# Patient Record
Sex: Female | Born: 1995 | Race: Black or African American | Hispanic: No | Marital: Single | Smoking: Current every day smoker
Health system: Southern US, Community
[De-identification: ages and names within clinical notes are randomized; demographics above are authoritative.]

## PROBLEM LIST (undated history)

## (undated) DIAGNOSIS — T7840XA Allergy, unspecified, initial encounter: Secondary | ICD-10-CM

## (undated) HISTORY — DX: Allergy, unspecified, initial encounter: T78.40XA

---

## 2015-06-16 ENCOUNTER — Ambulatory Visit (INDEPENDENT_AMBULATORY_CARE_PROVIDER_SITE_OTHER): Payer: BLUE CROSS/BLUE SHIELD | Admitting: Emergency Medicine

## 2015-06-16 VITALS — BP 110/68 | HR 74 | Temp 99.7°F | Resp 16 | Ht 65.5 in | Wt 107.2 lb

## 2015-06-16 DIAGNOSIS — R6889 Other general symptoms and signs: Secondary | ICD-10-CM

## 2015-06-16 DIAGNOSIS — R509 Fever, unspecified: Secondary | ICD-10-CM

## 2015-06-16 LAB — POCT INFLUENZA A/B
INFLUENZA A, POC: NEGATIVE
Influenza B, POC: NEGATIVE

## 2015-06-16 MED ORDER — OSELTAMIVIR PHOSPHATE 75 MG PO CAPS: 75.0000 mg | ORAL_CAPSULE | Freq: Two times a day (BID) | ORAL | Status: DC

## 2015-06-16 NOTE — Progress Notes (Signed)
Patient ID: Sally Roberts, female   DOB: 1995-05-06, 20 y.o.   MRN: [id number]     By signing my name below, I, Littie Deeds, attest that this documentation has been prepared under the direction and in the presence of Lesle Chris, MD.  Electronically Signed: Littie Deeds, Medical Scribe. 06/16/2015. 10:57 AM.   Chief Complaint:  Chief Complaint  Patient presents with  . Influenza    HPI: Sally Roberts is a 20 y.o. female who reports to [facility] today complaining of gradual onset cough that started last night. Patient reports having associated fever, chills, headache, and generalized myalgias. She believes she may have the flu. Patient denies sick contacts with flu. She did not receive the flu shot this year. She does not do any regular exercise. There have been no tick exposure.  Patient is in school and also works at [employer].  Past Medical History  Diagnosis Date  . Allergy    No past surgical history on file. Social History   Social History  . Marital Status: Single    Spouse Name: N/A  . Number of Children: N/A  . Years of Education: N/A   Social History Main Topics  . Smoking status: Current Every Day Smoker  . Smokeless tobacco: None  . Alcohol Use: 0.0 oz/week    0 Standard drinks or equivalent per week  . Drug Use: None  . Sexual Activity: Not Asked   Other Topics Concern  . None   Social History Narrative  . None   Family History  Problem Relation Age of Onset  . Diabetes Maternal Grandfather   . Diabetes Paternal Grandmother    Allergies  Allergen Reactions  . Shellfish Allergy Shortness Of Breath and Other (See Comments)    Sores in mouth   Prior to Admission medications   Medication Sig Start Date End Date Taking? Authorizing Provider  medroxyPROGESTERone (DEPO-PROVERA) 150 MG/ML injection Inject 150 mg into the muscle every 3 (three) months.   Yes Historical Provider, MD     ROS: The patient denies unintentional weight loss,  chest pain, palpitations, wheezing, dyspnea on exertion, nausea, vomiting, abdominal pain, dysuria, hematuria, melena, numbness, weakness, or tingling.  All other systems have been reviewed and were otherwise negative with the exception of those mentioned in the HPI and as above.    PHYSICAL EXAM: Filed Vitals:   06/16/15 1037  BP: 110/68  Pulse: 74  Temp: 99.7 F (37.6 C)  Resp: 16   Body mass index is 17.56 kg/(m^2).   General: Alert, no acute distress. Appears ill, but non-toxic. HEENT:  Normocephalic, atraumatic, oropharynx patent. Nasal congestion.  Eye: Nonie Hoyer [facility] Cardiovascular:  Regular rate and rhythm, no rubs murmurs or gallops.  No Carotid bruits, radial pulse intact. No pedal edema.  Respiratory: Clear to auscultation bilaterally.  No wheezes, rales, or rhonchi.  No cyanosis, no use of accessory musculature Abdominal: No organomegaly, abdomen is soft and non-tender, positive bowel sounds.  No masses. Musculoskeletal: Gait intact. No edema, tenderness Skin: No rashes. Neurologic: Facial musculature symmetric. Psychiatric: Patient acts appropriately throughout our interaction. Lymphatic: No cervical or submandibular lymphadenopathy    LABS: Results for orders placed or performed in visit on 06/16/15  POCT Influenza A/B  Result Value Ref Range   Influenza A, POC Negative Negative   Influenza B, POC Negative Negative     EKG/XRAY:   Primary read interpreted by Dr. Cleta Alberts at [facility].   ASSESSMENT/PLAN: Patient presents with what we have been  seeing his typical flu symptoms. Placed on Tamiflu. The flu swab we did was negative. She was given notes for work.I personally performed the services described in this documentation, which was scribed in my presence. The recorded information has been reviewed and is accurate.    Gross sideeffects, risk and benefits, and alternatives of medications d/w patient. Patient is aware that all medications have potential sideeffects  and we are unable to predict every sideeffect or drug-drug interaction that may occur.  Lesle ChrisSteven Daub MD 06/16/2015 10:57 AM

## 2015-06-16 NOTE — Patient Instructions (Signed)

## 2015-06-23 ENCOUNTER — Ambulatory Visit: Payer: BLUE CROSS/BLUE SHIELD

## 2015-07-01 ENCOUNTER — Emergency Department (HOSPITAL_COMMUNITY)
Admission: EM | Admit: 2015-07-01 | Discharge: 2015-07-01 | Disposition: A | Discharge status: Home / Self Care | Payer: BLUE CROSS/BLUE SHIELD | Attending: Emergency Medicine | Admitting: Emergency Medicine

## 2015-07-01 ENCOUNTER — Encounter (HOSPITAL_COMMUNITY): Payer: Self-pay | Admitting: Family Medicine

## 2015-07-01 DIAGNOSIS — F17200 Nicotine dependence, unspecified, uncomplicated: Secondary | ICD-10-CM | POA: Diagnosis not present

## 2015-07-01 DIAGNOSIS — Z79899 Other long term (current) drug therapy: Secondary | ICD-10-CM | POA: Diagnosis not present

## 2015-07-01 DIAGNOSIS — H109 Unspecified conjunctivitis: Secondary | ICD-10-CM | POA: Diagnosis not present

## 2015-07-01 DIAGNOSIS — H578 Other specified disorders of eye and adnexa: Secondary | ICD-10-CM | POA: Diagnosis present

## 2015-07-01 MED ORDER — POLYMYXIN B-TRIMETHOPRIM 10000-0.1 UNIT/ML-% OP SOLN: 1.0000 [drp] | OPHTHALMIC | Status: DC

## 2015-07-01 NOTE — ED Provider Notes (Signed)
CSN: [id number]     Arrival date & time 07/01/15  1020 History  By signing my name below, I, Placido SouLogan Joldersma, attest that this documentation has been prepared under the direction and in the presence of [facility] HospitalJaime Pilcher Sedalia Greeson, PA-C. Electronically Signed: Placido SouLogan Joldersma, ED Scribe. 07/01/2015. 11:36 AM.   Chief Complaint  Patient presents with  . Eye Problem   The history is provided by the patient. No language interpreter was used.    HPI Comments: Sally Roberts is a 20 y.o. female who presents to the Emergency Department complaining of worsening, mild, bilateral eye pain x 1 week with associated, mild, worsening, left eye swelling x 1 day. She reports initially going to the doctor when her symptoms presented and left prior to being seen due to a co-pay and a lack of funds. She reports associated, mild, eye crusting. Pt has applied OTC eyedrops without significant relief. Pt reports having flu-like symptoms 2 weeks ago which alleviated prior to the onset of her eye symptoms. Pt denies wearing rx eyewear or contact lenses. She denies visual disturbances or any other associated symptoms at this time.    Past Medical History  Diagnosis Date  . Allergy    History reviewed. No pertinent past surgical history. Family History  Problem Relation Age of Onset  . Diabetes Maternal Grandfather   . Diabetes Paternal Grandmother    Social History  Substance Use Topics  . Smoking status: Current Every Day Smoker  . Smokeless tobacco: None  . Alcohol Use: 0.0 oz/week    0 Standard drinks or equivalent per week   OB History    No data available     Review of Systems  HENT: Negative for congestion.   Eyes: Positive for pain, discharge and redness. Negative for visual disturbance.  Respiratory: Negative for cough.     Allergies  Shellfish allergy  Home Medications   Prior to Admission medications   Medication Sig Start Date End Date Taking? Authorizing Provider  medroxyPROGESTERone  (DEPO-PROVERA) 150 MG/ML injection Inject 150 mg into the muscle every 3 (three) months.    Historical Provider, MD  oseltamivir (TAMIFLU) 75 MG capsule Take 1 capsule (75 mg total) by mouth 2 (two) times daily. 06/16/15   Collene GobbleSteven A Daub, MD  trimethoprim-polymyxin b (POLYTRIM) ophthalmic solution Place 1 drop into the left eye every 4 (four) hours. X 7 days. 07/01/15   Timoteo Carreiro Pilcher Tenee Wish, PA-C   BP 130/70 mmHg  Pulse 64  Temp(Src) 98 F (36.7 C) (Oral)  Resp 16  Wt 47.344 kg  SpO2 100%  LMP  (LMP Unknown)    Physical Exam  Constitutional: She is oriented to person, place, and time. She appears well-developed and well-nourished.  HENT:  Head: Normocephalic and atraumatic.  Mouth/Throat: Oropharynx is clear and moist. No oropharyngeal exudate.  Eyes: EOM are normal. Pupils are equal, round, and reactive to light.  Bilateral conjunctiva mildly injected. Left greater than right.  Neck: Normal range of motion.  Cardiovascular: Normal rate, regular rhythm and normal heart sounds.  Exam reveals no gallop and no friction rub.   No murmur heard. Pulmonary/Chest: Effort normal and breath sounds normal. No respiratory distress. She has no wheezes. She has no rales.  Musculoskeletal: Normal range of motion.  Lymphadenopathy:    She has no cervical adenopathy.  Neurological: She is alert and oriented to person, place, and time.  Skin: Skin is warm and dry.  Nursing note and vitals reviewed.   ED Course  Procedures  DIAGNOSTIC STUDIES: Oxygen Saturation is 100% on RA, normal by my interpretation.    COORDINATION OF CARE: 11:35 AM Discussed next steps with pt. She verbalized understanding and is agreeable with the plan.   Labs Review Labs Reviewed - No data to display  Imaging Review No results found.   EKG Interpretation None      MDM   Final diagnoses:  Conjunctivitis of left eye   Sally Roberts presents to ED for bilateral eye discharge, irritation. Left eye worse than  right. On exam, bilateral conjunctiva mildly injected, left slightly greater than right. Left eye with mild swelling appreciated. Viral versus bacterial conjunctivitis-will treat with Polytrim. Patient informed to follow-up with PCP if symptoms do not improve or worsen. She is not a contact wearer. All questions were answered.  I personally performed the services described in this documentation, which was scribed in my presence. The recorded information has been reviewed and is accurate.  [facility] Susy Placzek, PA-C 07/01/15 1323  Cathren Laine, MD 07/02/15 [phone]

## 2015-07-01 NOTE — ED Notes (Signed)
Declined W/C at D/C and was escorted to lobby by RN. 

## 2015-07-01 NOTE — ED Notes (Signed)
Pt here for right eye swelling. sts that she has been using eye drops OTC without relief. sts minimal  pain. No vision disturbances.

## 2015-07-01 NOTE — Discharge Instructions (Signed)
Conjunctivitis is commonly called "pink eye." Conjunctivitis can be caused by bacterial or viral infection, allergies, or injuries. There is usually redness of the lining of the eye, itching, discomfort, and sometimes discharge. Pink eye is very contagious and spreads by direct contact. Try to avoid rubbing eyes and wash hands often. ° °You may be given antibiotic eyedrops as part of your treatment. Before using your eye medicine, remove all drainage from the eye by washing gently with warm water and cotton balls. Continue to use the medication until you have awakened 2 mornings in a row without discharge from the eye. Do not rub your eye. This increases the irritation and helps spread infection. Use separate towels from other household members. Wash your hands with soap and water before and after touching your eyes. Use cold compresses to reduce pain and sunglasses to relieve irritation from light. Do not wear contact lenses or wear eye makeup until the infection is gone.  ° °SEEK MEDICAL CARE IF:  °Your symptoms are not better after 3 days of treatment.  °You have increased pain or trouble seeing.  °The outer eyelids become very red or swollen.  °You develop double vision or your vision becomes blurred or worsens in any way.  °You have trouble moving your eyes.  °You develop a severe headache, severe neck pain, or neck stiffness.  °You develop repeated vomiting.  °You have a fever or persistent symptoms for more than 72 hours.  °You have a fever and your symptoms suddenly get worse.  °

## 2015-12-23 ENCOUNTER — Ambulatory Visit (INDEPENDENT_AMBULATORY_CARE_PROVIDER_SITE_OTHER): Payer: BLUE CROSS/BLUE SHIELD | Admitting: Family Medicine

## 2015-12-23 ENCOUNTER — Ambulatory Visit (INDEPENDENT_AMBULATORY_CARE_PROVIDER_SITE_OTHER): Payer: BLUE CROSS/BLUE SHIELD

## 2015-12-23 VITALS — BP 120/74 | HR 70 | Temp 98.8°F | Resp 18 | Ht 65.5 in | Wt 102.0 lb

## 2015-12-23 DIAGNOSIS — R110 Nausea: Secondary | ICD-10-CM

## 2015-12-23 DIAGNOSIS — R0602 Shortness of breath: Secondary | ICD-10-CM | POA: Diagnosis not present

## 2015-12-23 DIAGNOSIS — J069 Acute upper respiratory infection, unspecified: Secondary | ICD-10-CM | POA: Diagnosis not present

## 2015-12-23 LAB — POCT INFLUENZA A/B
INFLUENZA A, POC: NEGATIVE
INFLUENZA B, POC: NEGATIVE

## 2015-12-23 LAB — POCT CBC
Granulocyte percent: 54.4 %G (ref 37–80)
HCT, POC: 37.2 % — AB (ref 37.7–47.9)
Hemoglobin: 13 g/dL (ref 12.2–16.2)
Lymph, poc: 1.6 (ref 0.6–3.4)
MCH: 33.1 pg — AB (ref 27–31.2)
MCHC: 35 g/dL (ref 31.8–35.4)
MCV: 94.5 fL (ref 80–97)
MID (CBC): 0.5 (ref 0–0.9)
MPV: 8.9 fL (ref 0–99.8)
PLATELET COUNT, POC: 204 10*3/uL (ref 142–424)
POC Granulocyte: 2.4 (ref 2–6.9)
POC LYMPH PERCENT: 35.2 %L (ref 10–50)
POC MID %: 10.4 %M (ref 0–12)
RBC: 3.93 M/uL — AB (ref 4.04–5.48)
RDW, POC: 12.8 %
WBC: 4.5 10*3/uL — AB (ref 4.6–10.2)

## 2015-12-23 LAB — GLUCOSE, POCT (MANUAL RESULT ENTRY): POC GLUCOSE: 79 mg/dL (ref 70–99)

## 2015-12-23 LAB — POCT URINE PREGNANCY: PREG TEST UR: NEGATIVE

## 2015-12-23 MED ORDER — LORATADINE-PSEUDOEPHEDRINE ER 5-120 MG PO TB12: 1.0000 | ORAL_TABLET | Freq: Two times a day (BID) | ORAL | 0 refills | Status: DC

## 2015-12-23 MED ORDER — BENZONATATE 100 MG PO CAPS: 100.0000 mg | ORAL_CAPSULE | Freq: Three times a day (TID) | ORAL | 0 refills | Status: DC | PRN

## 2015-12-23 NOTE — Patient Instructions (Addendum)
IF you received an x-ray today, you will receive an invoice from [facility]. Please contact [facility] at [phone] with questions or concerns regarding your invoice.   IF you received labwork today, you will receive an invoice from [facility]. Please contact [facility] at [phone] with questions or concerns regarding your invoice.   Our billing staff will not be able to assist you with questions regarding bills from these companies.  You will be contacted with the lab results as soon as they are available. The fastest way to get your results is to activate your My Chart account. Instructions are located on the last page of this paperwork. If you have not heard from us regarding the results in 2 weeks, please contact this office.   Upper Respiratory Infection, Adult Most upper respiratory infections (URIs) are a viral infection of the air passages leading to the lungs. A URI affects the nose, throat, and upper air passages. The most common type of URI is nasopharyngitis and is typically referred to as "the common cold." URIs run their course and usually go away on their own. Most of the time, a URI does not require medical attention, but sometimes a bacterial infection in the upper airways can follow a viral infection. This is called a secondary infection. Sinus and middle ear infections are common types of secondary upper respiratory infections. Bacterial pneumonia can also complicate a URI. A URI can worsen asthma and chronic obstructive pulmonary disease (COPD). Sometimes, these complications can require emergency medical care and may be life threatening.  CAUSES Almost all URIs are caused by viruses. A virus is a type of germ and can spread from one person to another.  RISKS FACTORS You may be at risk for a URI if:   You smoke.   You have chronic heart or lung disease.  You have a weakened defense (immune) system.   You are very young  or very old.   You have nasal allergies or asthma.  You work in crowded or poorly ventilated areas.  You work in health care facilities or schools. SIGNS AND SYMPTOMS  Symptoms typically develop 2-3 days after you come in contact with a cold virus. Most viral URIs last 7-10 days. However, viral URIs from the influenza virus (flu virus) can last 14-18 days and are typically more severe. Symptoms may include:   Runny or stuffy (congested) nose.   Sneezing.   Cough.   Sore throat.   Headache.   Fatigue.   Fever.   Loss of appetite.   Pain in your forehead, behind your eyes, and over your cheekbones (sinus pain).  Muscle aches.  DIAGNOSIS  Your health care provider may diagnose a URI by:  Physical exam.  Tests to check that your symptoms are not due to another condition such as:  Strep throat.  Sinusitis.  Pneumonia.  Asthma. TREATMENT  A URI goes away on its own with time. It cannot be cured with medicines, but medicines may be prescribed or recommended to relieve symptoms. Medicines may help:  Reduce your fever.  Reduce your cough.  Relieve nasal congestion. HOME CARE INSTRUCTIONS   Take medicines only as directed by your health care provider.   Gargle warm saltwater or take cough drops to comfort your throat as directed by your health care provider.  Use a warm mist humidifier or inhale steam from a shower to increase air moisture. This may make it easier to breathe.  Drink enough fluid to keep your   urine clear or pale yellow.   Eat soups and other clear broths and maintain good nutrition.   Rest as needed.   Return to work when your temperature has returned to normal or as your health care provider advises. You may need to stay home longer to avoid infecting others. You can also use a face mask and careful hand washing to prevent spread of the virus.  Increase the usage of your inhaler if you have asthma.   Do not use any tobacco  products, including cigarettes, chewing tobacco, or electronic cigarettes. If you need help quitting, ask your health care provider. PREVENTION  The best way to protect yourself from getting a cold is to practice good hygiene.   Avoid oral or hand contact with people with cold symptoms.   Wash your hands often if contact occurs.  There is no clear evidence that vitamin C, vitamin E, echinacea, or exercise reduces the chance of developing a cold. However, it is always recommended to get plenty of rest, exercise, and practice good nutrition.  SEEK MEDICAL CARE IF:   You are getting worse rather than better.   Your symptoms are not controlled by medicine.   You have chills.  You have worsening shortness of breath.  You have brown or red mucus.  You have yellow or brown nasal discharge.  You have pain in your face, especially when you bend forward.  You have a fever.  You have swollen neck glands.  You have pain while swallowing.  You have white areas in the back of your throat. SEEK IMMEDIATE MEDICAL CARE IF:   You have severe or persistent:  Headache.  Ear pain.  Sinus pain.  Chest pain.  You have chronic lung disease and any of the following:  Wheezing.  Prolonged cough.  Coughing up blood.  A change in your usual mucus.  You have a stiff neck.  You have changes in your:  Vision.  Hearing.  Thinking.  Mood. MAKE SURE YOU:   Understand these instructions.  Will watch your condition.  Will get help right away if you are not doing well or get worse.   This information is not intended to replace advice given to you by your health care provider. Make sure you discuss any questions you have with your health care provider.   Document Released: 08/21/2000 Document Revised: 07/12/2014 Document Reviewed: 06/02/2013 Elsevier Interactive Patient Education 2016 Elsevier Inc.  

## 2015-12-23 NOTE — Progress Notes (Signed)
Subjective:    Patient ID: Sally Roberts, female    DOB: 1995-08-15, 20 y.o.   MRN: [id number]  12/23/2015  Headache; Nausea; and Shortness of Breath   HPI This 20 y.o. female presents for evaluation of headache, nausea, and SOB.  Onset of cold symptoms and other symptoms.  +nasal congestion and cough and really bad headache and nausea.  When woke up to get ready for work, felt SOB and had tachycardia and sweats.  Worried about pregnancy.  Googled symptoms; worried about pregnancy.  Onset two days ago.  No fever; +chills yet more +sweats. +HA for three days.  +nausea with headaches; no history of migraines.  No ear pain; no sore throat. +decreased appetite.  +nasal congestion yesterday; +rhinorrhea when sleeping.  +coughing onset yesterday.  +SOB this morning; no SOB now.  No vomiting; +nausea this morning.  Having some abdominal pain; snacking on small amounts of food.  No diarrhea.  LMP 12-05-15 WNL.  Sexually active; taking OCP; no longer taking DepoProvera. Has been late taking pill twice; late by 2 hours.  Student at [employer]; senior.  Working at [employer].  No sick contacts.  Has taken Aleve for headache with improvement.  No tobacco abuse; smokes marijuana. Ate breakfast this morning (one egg, 2 pieces of bacon).  Last night, ate granola bar, poptart, rice.   PCP: Pamala Duffel in [place], [place]   Review of Systems  Constitutional: Positive for chills and diaphoresis. Negative for fatigue and fever.  HENT: Positive for congestion, rhinorrhea and voice change. Negative for ear pain, sore throat and trouble swallowing.   Eyes: Negative for visual disturbance.  Respiratory: Positive for cough and shortness of breath. Negative for wheezing.   Cardiovascular: Negative for chest pain, palpitations and leg swelling.  Gastrointestinal: Positive for abdominal pain and nausea. Negative for abdominal distention, constipation, diarrhea and vomiting.  Endocrine: Negative for cold intolerance, heat  intolerance, polydipsia, polyphagia and polyuria.  Neurological: Negative for dizziness, tremors, seizures, syncope, facial asymmetry, speech difficulty, weakness, light-headedness, numbness and headaches.    Past Medical History:  Diagnosis Date  . Allergy    History reviewed. No pertinent surgical history. Allergies  Allergen Reactions  . Shellfish Allergy Shortness Of Breath and Other (See Comments)    Sores in mouth    Social History   Social History  . Marital status: Single    Spouse name: N/A  . Number of children: N/A  . Years of education: N/A   Occupational History  . Not on file.   Social History Main Topics  . Smoking status: Current Every Day Smoker  . Smokeless tobacco: Never Used  . Alcohol use 0.0 oz/week  . Drug use: No  . Sexual activity: Not on file   Other Topics Concern  . Not on file   Social History Narrative  . No narrative on file   Family History  Problem Relation Age of Onset  . Diabetes Maternal Grandfather   . Diabetes Paternal Grandmother        Objective:    BP 120/74 (BP Location: Right Arm, Patient Position: Sitting, Cuff Size: Small)   Pulse 70   Temp 98.8 F (37.1 C) (Oral)   Resp 18   Ht 5' 5.5" (1.664 m)   Wt 102 lb (46.3 kg)   LMP 12/02/2015   SpO2 99%   BMI 16.72 kg/m  Physical Exam  Constitutional: She is oriented to person, place, and time. She appears well-developed and well-nourished. No distress.  HENT:  Head: Normocephalic and atraumatic.  Right Ear: External ear normal.  Left Ear: External ear normal.  Nose: Nose normal.  Mouth/Throat: Oropharynx is clear and moist.  Eyes: Conjunctivae and EOM are normal. Pupils are equal, round, and reactive to light.  Neck: Normal range of motion. Neck supple. Carotid bruit is not present. No thyromegaly present.  Cardiovascular: Normal rate, regular rhythm, normal heart sounds and intact distal pulses.  Exam reveals no gallop and no friction rub.   No murmur  heard. Pulmonary/Chest: Effort normal and breath sounds normal. She has no wheezes. She has no rales.  Abdominal: Soft. Bowel sounds are normal. She exhibits no distension and no mass. There is no tenderness. There is no rebound and no guarding.  Lymphadenopathy:    She has no cervical adenopathy.  Neurological: She is alert and oriented to person, place, and time. No cranial nerve deficit.  Skin: Skin is warm and dry. No rash noted. She is not diaphoretic. No erythema. No pallor.  Psychiatric: She has a normal mood and affect. Her behavior is normal.   Results for orders placed or performed in visit on 12/23/15  POCT urine pregnancy  Result Value Ref Range   Preg Test, Ur Negative Negative  POCT CBC  Result Value Ref Range   WBC 4.5 (A) 4.6 - 10.2 K/uL   Lymph, poc 1.6 0.6 - 3.4   POC LYMPH PERCENT 35.2 10 - 50 %L   MID (cbc) 0.5 0 - 0.9   POC MID % 10.4 0 - 12 %M   POC Granulocyte 2.4 2 - 6.9   Granulocyte percent 54.4 37 - 80 %G   RBC 3.93 (A) 4.04 - 5.48 M/uL   Hemoglobin 13.0 12.2 - 16.2 g/dL   HCT, POC 16.1 (A) 09.6 - 47.9 %   MCV 94.5 80 - 97 fL   MCH, POC 33.1 (A) 27 - 31.2 pg   MCHC 35.0 31.8 - 35.4 g/dL   RDW, POC 04.5 %   Platelet Count, POC 204 142 - 424 K/uL   MPV 8.9 0 - 99.8 fL  POCT glucose (manual entry)  Result Value Ref Range   POC Glucose 79 70 - 99 mg/dl  POCT Influenza A/B  Result Value Ref Range   Influenza A, POC Negative Negative   Influenza B, POC Negative Negative   Dg Chest 2 View  Result Date: 12/23/2015 CLINICAL DATA:  20 year old female with a history of shortness of breath EXAM: CHEST  2 VIEW COMPARISON:  None. FINDINGS: Cardiomediastinal silhouette within normal limits. No confluent airspace disease or pneumothorax.  No pleural effusion. Scoliotic curvature of the spine. No segmentation anomalies identified. No displaced fracture. IMPRESSION: No radiographic evidence of acute cardiopulmonary disease. Signed, Yvone Neu. Loreta Ave, DO [facility] Electronically Signed   By: Gilmer Mor D.O.   On: 12/23/2015 12:33       Assessment & Plan:   1. Acute upper respiratory infection   2. Nausea without vomiting   3. Shortness of breath    -New. -rx for Claritin D and Tessalon Perles provided. -continue Aleve or Tylenol for headache and fever; supportive care with rest, BRAT diet, hydration.   Orders Placed This Encounter  Procedures  . DG Chest 2 View    Standing Status:   Future    Number of Occurrences:   1    Standing Expiration Date:   12/22/2016    Order Specific Question:   Reason for Exam (SYMPTOM  OR DIAGNOSIS REQUIRED)    Answer:   SOB, tachycardic, cough, chills.    Order Specific Question:   Is the patient pregnant?    Answer:   No    Comments:   urine pregnancy negative    Order Specific Question:   Preferred imaging location?    Answer:   External  . POCT urine pregnancy  . POCT CBC  . POCT glucose (manual entry)  . POCT Influenza A/B   Meds ordered this encounter  Medications  . levonorgestrel-ethinyl estradiol (AVIANE,ALESSE,LESSINA) 0.1-20 MG-MCG tablet    Sig: Take 1 tablet by mouth daily.  . benzonatate (TESSALON) 100 MG capsule    Sig: Take 1-2 capsules (100-200 mg total) by mouth 3 (three) times daily as needed for cough.    Dispense:  40 capsule    Refill:  0  . loratadine-pseudoephedrine (CLARITIN-D 12-HOUR) 5-120 MG tablet    Sig: Take 1 tablet by mouth 2 (two) times daily.    Dispense:  14 tablet    Refill:  0    No Follow-up on file.   Ihor Meinzer Paulita FujitaMartin Clester Chlebowski, M.D. [facility]  [address] [place], [place]  [id number] [phone] phone [phone] fax

## 2016-07-23 ENCOUNTER — Encounter (HOSPITAL_COMMUNITY): Payer: Self-pay | Admitting: Emergency Medicine

## 2016-07-23 ENCOUNTER — Emergency Department (HOSPITAL_COMMUNITY)
Admission: EM | Admit: 2016-07-23 | Discharge: 2016-07-23 | Disposition: A | Discharge status: Home / Self Care | Payer: BLUE CROSS/BLUE SHIELD | Attending: Emergency Medicine | Admitting: Emergency Medicine

## 2016-07-23 ENCOUNTER — Emergency Department (HOSPITAL_COMMUNITY): Payer: BLUE CROSS/BLUE SHIELD

## 2016-07-23 DIAGNOSIS — R197 Diarrhea, unspecified: Secondary | ICD-10-CM | POA: Insufficient documentation

## 2016-07-23 DIAGNOSIS — R112 Nausea with vomiting, unspecified: Secondary | ICD-10-CM | POA: Insufficient documentation

## 2016-07-23 DIAGNOSIS — F17200 Nicotine dependence, unspecified, uncomplicated: Secondary | ICD-10-CM | POA: Diagnosis not present

## 2016-07-23 LAB — URINALYSIS, ROUTINE W REFLEX MICROSCOPIC
Bilirubin Urine: NEGATIVE
Glucose, UA: NEGATIVE mg/dL
Hgb urine dipstick: NEGATIVE
Ketones, ur: 20 mg/dL — AB
Leukocytes, UA: NEGATIVE
Nitrite: NEGATIVE
Protein, ur: 100 mg/dL — AB
Specific Gravity, Urine: 1.024 (ref 1.005–1.030)
pH: 9 — ABNORMAL HIGH (ref 5.0–8.0)

## 2016-07-23 LAB — COMPREHENSIVE METABOLIC PANEL
ALT: 17 U/L (ref 14–54)
ANION GAP: 11 (ref 5–15)
AST: 28 U/L (ref 15–41)
Albumin: 4.6 g/dL (ref 3.5–5.0)
Alkaline Phosphatase: 38 U/L (ref 38–126)
BILIRUBIN TOTAL: 1.2 mg/dL (ref 0.3–1.2)
BUN: 12 mg/dL (ref 6–20)
CALCIUM: 10.1 mg/dL (ref 8.9–10.3)
CO2: 19 mmol/L — ABNORMAL LOW (ref 22–32)
CREATININE: 0.8 mg/dL (ref 0.44–1.00)
Chloride: 104 mmol/L (ref 101–111)
GFR calc non Af Amer: >60 mL/min (ref >60)
Glucose, Bld: 121 mg/dL — ABNORMAL HIGH (ref 65–99)
Potassium: 3.2 mmol/L — ABNORMAL LOW (ref 3.5–5.1)
Sodium: 134 mmol/L — ABNORMAL LOW (ref 135–145)
TOTAL PROTEIN: 7.7 g/dL (ref 6.5–8.1)

## 2016-07-23 LAB — I-STAT BETA HCG BLOOD, ED (MC, WL, AP ONLY): I-stat hCG, quantitative: <5 m[IU]/mL (ref <5)

## 2016-07-23 LAB — CBC
HCT: 36.7 % (ref 36.0–46.0)
HEMOGLOBIN: 12.4 g/dL (ref 12.0–15.0)
MCH: 32 pg (ref 26.0–34.0)
MCHC: 33.8 g/dL (ref 30.0–36.0)
MCV: 94.6 fL (ref 78.0–100.0)
PLATELETS: 227 10*3/uL (ref 150–400)
RBC: 3.88 MIL/uL (ref 3.87–5.11)
RDW: 11.7 % (ref 11.5–15.5)
WBC: 5.7 10*3/uL (ref 4.0–10.5)

## 2016-07-23 LAB — LIPASE, BLOOD: Lipase: 16 U/L (ref 11–51)

## 2016-07-23 MED ORDER — SODIUM CHLORIDE 0.9 % IV BOLUS (SEPSIS)
2000.0000 mL | Freq: Once | INTRAVENOUS | Status: AC
  Administered 2016-07-23: 2000 mL via INTRAVENOUS

## 2016-07-23 MED ORDER — ONDANSETRON 4 MG PO TBDP: 4.0000 mg | ORAL_TABLET | Freq: Three times a day (TID) | ORAL | 0 refills | Status: DC | PRN

## 2016-07-23 MED ORDER — ONDANSETRON HCL 4 MG/2ML IJ SOLN
4.0000 mg | Freq: Once | INTRAMUSCULAR | Status: AC
  Administered 2016-07-23: 4 mg via INTRAVENOUS
  Filled 2016-07-23: qty 2

## 2016-07-23 NOTE — ED Provider Notes (Signed)
Emergency Department Provider Note   I have reviewed the triage vital signs and the nursing notes.   HISTORY  Chief Complaint Emesis   HPI Sally Roberts is a 21 y.o. female with PMH of seasonal allergies presents to the emergency room in for evaluation of intractable nausea and vomiting with some diarrhea. Symptoms began this morning. She awoke at approximately 2 AM with diffuse abdominal discomfort which soon turned to nausea, vomiting, diarrhea. No blood in her emesis or stool. After multiple episodes of vomiting she began to have some chest discomfort. Denies any radiation of pain. She continues to feel very nauseated. No modifying factors. She's been unable to eat or drink.   Past Medical History:  Diagnosis Date  . Allergy     There are no active problems to display for this patient.   History reviewed. No pertinent surgical history.  Current Outpatient Rx  . Order #: [id number] Class: Normal  . Order #: [id number] Class: Historical Med  . Order #: [id number] Class: Normal  . Order #: [id number] Class: Print    Allergies Shellfish allergy  Family History  Problem Relation Age of Onset  . Diabetes Maternal Grandfather   . Diabetes Paternal Grandmother     Social History Social History  Substance Use Topics  . Smoking status: Current Every Day Smoker  . Smokeless tobacco: Never Used  . Alcohol use 0.0 oz/week    Review of Systems  Constitutional: No fever/chills Eyes: No visual changes. ENT: No sore throat. Cardiovascular: Positive chest pain. Respiratory: Denies shortness of breath. Gastrointestinal: Positive abdominal pain. Positive nausea, vomiting, and diarrhea.  No constipation. Genitourinary: Negative for dysuria. Musculoskeletal: Negative for back pain. Skin: Negative for rash. Neurological: Negative for headaches, focal weakness or numbness.  10-point ROS otherwise negative.  ____________________________________________   PHYSICAL  EXAM:  VITAL SIGNS: ED Triage Vitals  Enc Vitals Group     BP 07/23/16 0837 132/71     Pulse Rate 07/23/16 0837 (!) 105     Resp 07/23/16 0837 18     Temp 07/23/16 0837 97.5 F (36.4 C)     Temp Source 07/23/16 0837 Oral     SpO2 07/23/16 0837 99 %     Weight 07/23/16 0837 100 lb (45.4 kg)     Height 07/23/16 0837 5\' 5"  (1.651 m)     Pain Score 07/23/16 0838 10   Constitutional: Alert and oriented. Well appearing and in no acute distress. Eyes: Conjunctivae are normal.  Head: Atraumatic. Nose: No congestion/rhinnorhea. Mouth/Throat: Mucous membranes are dry.  Oropharynx non-erythematous. Neck: No stridor. Cardiovascular: Normal rate, regular rhythm. Good peripheral circulation. Grossly normal heart sounds.   Respiratory: Normal respiratory effort.  No retractions. Lungs CTAB. Gastrointestinal: Soft and nontender. No distention.  Musculoskeletal: No lower extremity tenderness nor edema. No gross deformities of extremities. Neurologic:  Normal speech and language. No gross focal neurologic deficits are appreciated.  Skin:  Skin is warm, dry and intact. No rash noted. Psychiatric: Mood and affect are normal. Speech and behavior are normal.  ____________________________________________   LABS (all labs ordered are listed, but only abnormal results are displayed)  Labs Reviewed  COMPREHENSIVE METABOLIC PANEL - Abnormal; Notable for the following:       Result Value   Sodium 134 (*)    Potassium 3.2 (*)    CO2 19 (*)    Glucose, Bld 121 (*)    All other components within normal limits  URINALYSIS, ROUTINE W REFLEX MICROSCOPIC - Abnormal; Notable for the following:  APPearance HAZY (*)    pH 9.0 (*)    Ketones, ur 20 (*)    Protein, ur 100 (*)    Bacteria, UA FEW (*)    Squamous Epithelial / LPF 0-5 (*)    All other components within normal limits  LIPASE, BLOOD  CBC  I-STAT BETA HCG BLOOD, ED ([facility], [facility], [facility] ONLY)    ____________________________________________  RADIOLOGY  Dg Chest 2 View  Result Date: 07/23/2016 CLINICAL DATA:  Chest pain EXAM: CHEST  2 VIEW COMPARISON:  12/23/2015 FINDINGS: Heart and mediastinal contours are within normal limits. No focal opacities or effusions. No acute bony abnormality. Rightward scoliosis in the thoracolumbar spine. IMPRESSION: No active cardiopulmonary disease. Electronically Signed   By: Charlett Nose M.D.   On: 07/23/2016 10:40    ____________________________________________   PROCEDURES  Procedure(s) performed:   Procedures  None ____________________________________________   INITIAL IMPRESSION / ASSESSMENT AND PLAN / ED COURSE  Pertinent labs & imaging results that were available during my care of the patient were reviewed by me and considered in my medical decision making (see chart for details).  Patient resents to the emergency department for evaluation of intractable nausea, vomiting, diarrhea. Symptoms are most consistent with a viral gastroenteritis. Patient with no focal abdominal discomfort. Lab work consistent with mild dehydration. Given the patient's chest pain and plan to obtain a chest x-ray to evaluate lungs and mediastinum for abnormal air deposits. No blood or other evidence of an esophageal injury. Plan for IVF, nausea control, and PO challenge.   11:40 AM Patient reports feeling much better after IV fluids. She is drinking water in the ED without nausea or vomiting. Suspect viral etiology. Chest x-ray with no acute findings. Plan for supportive care at oral hydration at home.  At this time, I do not feel there is any life-threatening condition present. I have reviewed and discussed all results (EKG, imaging, lab, urine as appropriate), exam findings with patient. I have reviewed nursing notes and appropriate previous records.  I feel the patient is safe to be discharged home without further emergent workup. Discussed usual and  customary return precautions. Patient and family (if present) verbalize understanding and are comfortable with this plan.  Patient will follow-up with their primary care provider. If they do not have a primary care provider, information for follow-up has been provided to them. All questions have been answered.  ____________________________________________  FINAL CLINICAL IMPRESSION(S) / ED DIAGNOSES  Final diagnoses:  Nausea vomiting and diarrhea     MEDICATIONS GIVEN DURING THIS VISIT:  Medications  sodium chloride 0.9 % bolus 2,000 mL (2,000 mLs Intravenous New Bag/Given 07/23/16 1016)  ondansetron (ZOFRAN) injection 4 mg (4 mg Intravenous Given 07/23/16 1016)     NEW OUTPATIENT MEDICATIONS STARTED DURING THIS VISIT:  New Prescriptions   ONDANSETRON (ZOFRAN ODT) 4 MG DISINTEGRATING TABLET    Take 1 tablet (4 mg total) by mouth every 8 (eight) hours as needed for nausea or vomiting.      Note:  This document was prepared using Dragon voice recognition software and may include unintentional dictation errors.  Alona Bene, MD Emergency Medicine   Bralon Antkowiak, Arlyss Repress, MD 07/23/16 [phone]

## 2016-07-23 NOTE — ED Triage Notes (Signed)
Pt sts N/V/D starting this am; pt hyperventilating at present c/o bilateral hand tingling

## 2016-07-23 NOTE — Discharge Instructions (Signed)

## 2016-07-23 NOTE — ED Notes (Signed)
Patient transported to X-ray 

## 2017-06-14 ENCOUNTER — Other Ambulatory Visit: Payer: Self-pay

## 2017-06-14 ENCOUNTER — Emergency Department (HOSPITAL_COMMUNITY)
Admission: EM | Admit: 2017-06-14 | Discharge: 2017-06-14 | Disposition: A | Discharge status: Home / Self Care | Payer: BLUE CROSS/BLUE SHIELD | Attending: Emergency Medicine | Admitting: Emergency Medicine

## 2017-06-14 DIAGNOSIS — R1084 Generalized abdominal pain: Secondary | ICD-10-CM | POA: Diagnosis present

## 2017-06-14 DIAGNOSIS — R112 Nausea with vomiting, unspecified: Secondary | ICD-10-CM | POA: Insufficient documentation

## 2017-06-14 DIAGNOSIS — Z79899 Other long term (current) drug therapy: Secondary | ICD-10-CM | POA: Diagnosis not present

## 2017-06-14 DIAGNOSIS — F17200 Nicotine dependence, unspecified, uncomplicated: Secondary | ICD-10-CM | POA: Diagnosis not present

## 2017-06-14 LAB — COMPREHENSIVE METABOLIC PANEL
ALT: 20 U/L (ref 14–54)
AST: 28 U/L (ref 15–41)
Albumin: 4.6 g/dL (ref 3.5–5.0)
Alkaline Phosphatase: 43 U/L (ref 38–126)
Anion gap: 13 (ref 5–15)
BUN: 8 mg/dL (ref 6–20)
CO2: 18 mmol/L — ABNORMAL LOW (ref 22–32)
Calcium: 9.8 mg/dL (ref 8.9–10.3)
Chloride: 103 mmol/L (ref 101–111)
Creatinine, Ser: 0.76 mg/dL (ref 0.44–1.00)
GFR calc Af Amer: >60 mL/min (ref >60)
GFR calc non Af Amer: >60 mL/min (ref >60)
Glucose, Bld: 134 mg/dL — ABNORMAL HIGH (ref 65–99)
Potassium: 3.5 mmol/L (ref 3.5–5.1)
Sodium: 134 mmol/L — ABNORMAL LOW (ref 135–145)
Total Bilirubin: 0.9 mg/dL (ref 0.3–1.2)
Total Protein: 7.4 g/dL (ref 6.5–8.1)

## 2017-06-14 LAB — URINALYSIS, ROUTINE W REFLEX MICROSCOPIC
Bilirubin Urine: NEGATIVE
Glucose, UA: NEGATIVE mg/dL
Hgb urine dipstick: NEGATIVE
Ketones, ur: 5 mg/dL — AB
Leukocytes, UA: NEGATIVE
Nitrite: NEGATIVE
Protein, ur: 100 mg/dL — AB
Specific Gravity, Urine: 1.02 (ref 1.005–1.030)
pH: 9 — ABNORMAL HIGH (ref 5.0–8.0)

## 2017-06-14 LAB — CBC
HCT: 37.1 % (ref 36.0–46.0)
Hemoglobin: 12.5 g/dL (ref 12.0–15.0)
MCH: 32.1 pg (ref 26.0–34.0)
MCHC: 33.7 g/dL (ref 30.0–36.0)
MCV: 95.1 fL (ref 78.0–100.0)
Platelets: 212 10*3/uL (ref 150–400)
RBC: 3.9 MIL/uL (ref 3.87–5.11)
RDW: 12.2 % (ref 11.5–15.5)
WBC: 4.5 10*3/uL (ref 4.0–10.5)

## 2017-06-14 LAB — LIPASE, BLOOD: Lipase: 23 U/L (ref 11–51)

## 2017-06-14 LAB — I-STAT BETA HCG BLOOD, ED (MC, WL, AP ONLY): I-stat hCG, quantitative: <5 m[IU]/mL (ref <5)

## 2017-06-14 MED ORDER — ONDANSETRON HCL 4 MG/2ML IJ SOLN
4.0000 mg | Freq: Once | INTRAMUSCULAR | Status: AC
  Administered 2017-06-14: 4 mg via INTRAVENOUS
  Filled 2017-06-14: qty 2

## 2017-06-14 MED ORDER — LACTATED RINGERS IV BOLUS
1000.0000 mL | Freq: Once | INTRAVENOUS | Status: AC
  Administered 2017-06-14: 1000 mL via INTRAVENOUS

## 2017-06-14 MED ORDER — ONDANSETRON HCL 4 MG PO TABS: 4.0000 mg | ORAL_TABLET | Freq: Four times a day (QID) | ORAL | 0 refills | Status: DC | PRN

## 2017-06-14 NOTE — ED Triage Notes (Signed)
Pt. Stated, Im having stomach pain with Nausea and vomiting since this morning, Im dehydrated.

## 2017-06-14 NOTE — Discharge Instructions (Signed)
I suspect you have a viral GI illness. You have no concerning findings on your labs. You are being prescribed nausea medications to take as needed. Stay well hydrated. Return for significantly increasing pain, inability to keep water down or anything concerning to you.

## 2017-06-14 NOTE — ED Provider Notes (Signed)
[facility]   CSN: [id number] Arrival date & time: 06/14/17  0750     History   Chief Complaint Chief Complaint  Patient presents with  . Abdominal Pain  . Emesis    HPI Sally Roberts is a 22 y.o. female.  HPI   22 year old female with food.  Onset this morning.  Some diffuse crampy abdominal pain.  No fevers or chills.  No urinary complaints.  Denies any sick contacts.  She has not tried taking anything for her symptoms.  She does not think she is pregnant.  Past Medical History:  Diagnosis Date  . Allergy     There are no active problems to display for this patient.   No past surgical history on file.   OB History   None      Home Medications    Prior to Admission medications   Medication Sig Start Date End Date Taking? Authorizing Provider  benzonatate (TESSALON) 100 MG capsule Take 1-2 capsules (100-200 mg total) by mouth 3 (three) times daily as needed for cough. 12/23/15   Ethelda ChickSmith, Kristi M, MD  levonorgestrel-ethinyl estradiol (AVIANE,ALESSE,LESSINA) 0.1-20 MG-MCG tablet Take 1 tablet by mouth daily.    [provider]  loratadine-pseudoephedrine (CLARITIN-D 12-HOUR) 5-120 MG tablet Take 1 tablet by mouth 2 (two) times daily. 12/23/15   Ethelda ChickSmith, Kristi M, MD  ondansetron (ZOFRAN ODT) 4 MG disintegrating tablet Take 1 tablet (4 mg total) by mouth every 8 (eight) hours as needed for nausea or vomiting. 07/23/16   Long, Arlyss RepressJoshua G, MD    Family History Family History  Problem Relation Age of Onset  . Diabetes Maternal Grandfather   . Diabetes Paternal Grandmother     Social History Social History   Tobacco Use  . Smoking status: Current Every Day Smoker  . Smokeless tobacco: Never Used  Substance Use Topics  . Alcohol use: Yes    Alcohol/week: 0.0 oz  . Drug use: Yes    Types: Marijuana     Allergies   Shellfish allergy   Review of Systems Review of Systems  All systems reviewed  and negative, other than as noted in HPI.  Physical Exam Updated Vital Signs BP 120/75 (BP Location: Right Arm)   Pulse 79   Temp (!) 97.5 F (36.4 C) (Oral)   Resp 16   Ht 5\' 4"  (1.626 m)   SpO2 100%   BMI 17.16 kg/m   Physical Exam  Constitutional: She appears well-developed and well-nourished. No distress.  HENT:  Head: Normocephalic and atraumatic.  Eyes: Conjunctivae are normal. Right eye exhibits no discharge. Left eye exhibits no discharge.  Neck: Neck supple.  Cardiovascular: Normal rate, regular rhythm and normal heart sounds. Exam reveals no gallop and no friction rub.  No murmur heard. Pulmonary/Chest: Effort normal and breath sounds normal. No respiratory distress.  Abdominal: Soft. She exhibits no distension. There is no tenderness.  Musculoskeletal: She exhibits no edema or tenderness.  Neurological: She is alert.  Skin: Skin is warm and dry.  Psychiatric: She has a normal mood and affect. Her behavior is normal. Thought content normal.  Nursing note and vitals reviewed.    ED Treatments / Results  Labs (all labs ordered are listed, but only abnormal results are displayed) Labs Reviewed  COMPREHENSIVE METABOLIC PANEL - Abnormal; Notable for the following components:      Result Value   Sodium 134 (*)    CO2 18 (*)    Glucose, Bld  134 (*)    All other components within normal limits  URINALYSIS, ROUTINE W REFLEX MICROSCOPIC - Abnormal; Notable for the following components:   APPearance HAZY (*)    pH 9.0 (*)    Ketones, ur 5 (*)    Protein, ur 100 (*)    Bacteria, UA RARE (*)    Squamous Epithelial / LPF 6-30 (*)    All other components within normal limits  LIPASE, BLOOD  CBC  I-STAT BETA HCG BLOOD, ED ([facility], [facility], [facility] ONLY)    EKG None  Radiology No results found.  Procedures Procedures (including critical care time)  Medications Ordered in ED Medications  lactated ringers bolus 1,000 mL (0 mLs Intravenous Stopped 06/14/17 1206)  ondansetron  (ZOFRAN) injection 4 mg (4 mg Intravenous Given 06/14/17 1122)     Initial Impression / Assessment and Plan / ED Course  I have reviewed the triage vital signs and the nursing notes.  Pertinent labs & imaging results that were available during my care of the patient were reviewed by me and considered in my medical decision making (see chart for details).     22 year old female with nausea and vomiting.  Suspect viral GI illness.  Her abdominal exam is benign.  She is afebrile nontoxic in appearance.  Minimal tenderness on exam.  I have a low suspicion for acute surgical emergency or other emergent process.  Has treated symptomatic with IV fluids and antiemetics with improvement.  Plan continue systematic treatment.  Return precautions were discussed.  Final Clinical Impressions(s) / ED Diagnoses   Final diagnoses:  Nausea and vomiting, intractability of vomiting not specified, unspecified vomiting type    ED Discharge Orders    None       Raeford Razor, MD 06/17/17 [phone]

## 2017-06-14 NOTE — ED Notes (Signed)
Patient verbalizes understanding of discharge instructions. Opportunity for questioning and answers were provided. Armband removed by staff, pt discharged from ED.  

## 2017-07-25 ENCOUNTER — Encounter: Payer: Self-pay | Admitting: Family Medicine

## 2017-07-30 ENCOUNTER — Encounter: Payer: Self-pay | Admitting: Family Medicine

## 2017-09-13 ENCOUNTER — Other Ambulatory Visit: Payer: Self-pay

## 2017-09-13 ENCOUNTER — Emergency Department (HOSPITAL_COMMUNITY)
Admission: EM | Admit: 2017-09-13 | Discharge: 2017-09-13 | Disposition: A | Discharge status: Home / Self Care | Payer: BLUE CROSS/BLUE SHIELD | Attending: Emergency Medicine | Admitting: Emergency Medicine

## 2017-09-13 ENCOUNTER — Encounter (HOSPITAL_COMMUNITY): Payer: Self-pay | Admitting: Emergency Medicine

## 2017-09-13 DIAGNOSIS — Z79899 Other long term (current) drug therapy: Secondary | ICD-10-CM | POA: Insufficient documentation

## 2017-09-13 DIAGNOSIS — K2920 Alcoholic gastritis without bleeding: Secondary | ICD-10-CM | POA: Insufficient documentation

## 2017-09-13 DIAGNOSIS — R112 Nausea with vomiting, unspecified: Secondary | ICD-10-CM | POA: Diagnosis not present

## 2017-09-13 DIAGNOSIS — F17200 Nicotine dependence, unspecified, uncomplicated: Secondary | ICD-10-CM | POA: Insufficient documentation

## 2017-09-13 LAB — URINALYSIS, ROUTINE W REFLEX MICROSCOPIC
BACTERIA UA: NONE SEEN
Bilirubin Urine: NEGATIVE
Glucose, UA: NEGATIVE mg/dL
HGB URINE DIPSTICK: NEGATIVE
Ketones, ur: 5 mg/dL — AB
LEUKOCYTES UA: NEGATIVE
NITRITE: NEGATIVE
PH: 9 — AB (ref 5.0–8.0)
Protein, ur: 30 mg/dL — AB
SPECIFIC GRAVITY, URINE: 1.015 (ref 1.005–1.030)

## 2017-09-13 LAB — COMPREHENSIVE METABOLIC PANEL
ALT: 32 U/L (ref 0–44)
ANION GAP: 14 (ref 5–15)
AST: 32 U/L (ref 15–41)
Albumin: 4.4 g/dL (ref 3.5–5.0)
Alkaline Phosphatase: 41 U/L (ref 38–126)
CHLORIDE: 102 mmol/L (ref 98–111)
CO2: 22 mmol/L (ref 22–32)
Calcium: 9.9 mg/dL (ref 8.9–10.3)
Creatinine, Ser: 0.78 mg/dL (ref 0.44–1.00)
GFR calc non Af Amer: >60 mL/min (ref >60)
Glucose, Bld: 113 mg/dL — ABNORMAL HIGH (ref 70–99)
POTASSIUM: 3.4 mmol/L — AB (ref 3.5–5.1)
Sodium: 138 mmol/L (ref 135–145)
TOTAL PROTEIN: 7.1 g/dL (ref 6.5–8.1)
Total Bilirubin: 1 mg/dL (ref 0.3–1.2)

## 2017-09-13 LAB — CBC
HEMATOCRIT: 36 % (ref 36.0–46.0)
Hemoglobin: 11.7 g/dL — ABNORMAL LOW (ref 12.0–15.0)
MCH: 31.8 pg (ref 26.0–34.0)
MCHC: 32.5 g/dL (ref 30.0–36.0)
MCV: 97.8 fL (ref 78.0–100.0)
Platelets: 235 10*3/uL (ref 150–400)
RBC: 3.68 MIL/uL — AB (ref 3.87–5.11)
RDW: 11.8 % (ref 11.5–15.5)
WBC: 6.2 10*3/uL (ref 4.0–10.5)

## 2017-09-13 LAB — LIPASE, BLOOD: LIPASE: 24 U/L (ref 11–51)

## 2017-09-13 LAB — I-STAT BETA HCG BLOOD, ED (MC, WL, AP ONLY): I-stat hCG, quantitative: <5 m[IU]/mL (ref <5)

## 2017-09-13 LAB — ETHANOL: Alcohol, Ethyl (B): <10 mg/dL (ref <10)

## 2017-09-13 MED ORDER — ONDANSETRON 4 MG PO TBDP: 4.0000 mg | ORAL_TABLET | Freq: Three times a day (TID) | ORAL | 0 refills | Status: AC | PRN

## 2017-09-13 MED ORDER — SODIUM CHLORIDE 0.9 % IV BOLUS
2000.0000 mL | Freq: Once | INTRAVENOUS | Status: AC
  Administered 2017-09-13: 2000 mL via INTRAVENOUS

## 2017-09-13 MED ORDER — GI COCKTAIL ~~LOC~~
30.0000 mL | Freq: Once | ORAL | Status: AC
  Administered 2017-09-13: 30 mL via ORAL
  Filled 2017-09-13: qty 30

## 2017-09-13 MED ORDER — ONDANSETRON 4 MG PO TBDP
4.0000 mg | ORAL_TABLET | Freq: Once | ORAL | Status: AC | PRN
  Administered 2017-09-13: 4 mg via ORAL
  Filled 2017-09-13: qty 1

## 2017-09-13 NOTE — Discharge Instructions (Signed)
Please read and follow all provided instructions.  Your diagnoses today include:  1. Acute alcoholic gastritis without hemorrhage   2. Non-intractable vomiting with nausea, unspecified vomiting type     Tests performed today include:  Blood counts and electrolytes  Blood tests to check liver and kidney function  Blood tests to check pancreas function  Urine test to look for infection and pregnancy (in women)  Vital signs. See below for your results today.   Medications prescribed:   Take any prescribed medications only as directed.  Home care instructions:   Follow any educational materials contained in this packet.   Your abdominal pain, nausea, vomiting, and diarrhea may be caused by a viral gastroenteritis also called 'stomach flu'. You should rest for the next several days. Keep drinking plenty of fluids and use the medicine for nausea as directed.    Drink clear liquids for the next 24 hours and introduce solid foods slowly after 24 hours using the b.r.a.t. diet (Bananas, Rice, Applesauce, Toast, Yogurt).    Follow-up instructions: Please follow-up with your primary care provider in the next 2 days for further evaluation of your symptoms. If you are not feeling better in 48 hours you may have a condition that is more serious and you need re-evaluation.   Return instructions:  SEEK IMMEDIATE MEDICAL ATTENTION IF:  If you have pain that does not go away or becomes severe   A temperature above 101F develops   Repeated vomiting occurs (multiple episodes)   If you have pain that becomes localized to portions of the abdomen. The right side could possibly be appendicitis. In an adult, the left lower portion of the abdomen could be colitis or diverticulitis.   Blood is being passed in stools or vomit (bright red or black tarry stools)   You develop chest pain, difficulty breathing, dizziness or fainting, or become confused, poorly responsive, or inconsolable (young  children)  If you have any other emergent concerns regarding your health  Additional Information: Abdominal (belly) pain can be caused by many things. Your caregiver performed an examination and possibly ordered blood/urine tests and imaging (CT scan, x-rays, ultrasound). Many cases can be observed and treated at home after initial evaluation in the emergency department. Even though you are being discharged home, abdominal pain can be unpredictable. Therefore, you need a repeated exam if your pain does not resolve, returns, or worsens. Most patients with abdominal pain don't have to be admitted to the hospital or have surgery, but serious problems like appendicitis and gallbladder attacks can start out as nonspecific pain. Many abdominal conditions cannot be diagnosed in one visit, so follow-up evaluations are very important.  Your vital signs today were: BP 102/63 (BP Location: Left Arm)    Pulse (!) 56    Temp 97.6 F (36.4 C) (Oral)    Resp 16    Ht 5\' 5"  (1.651 m)    Wt 44.5 kg (98 lb)    LMP 09/03/2017    SpO2 100%    BMI 16.31 kg/m  If your blood pressure (bp) was elevated above 135/85 this visit, please have this repeated by your doctor within one month. --------------

## 2017-09-13 NOTE — ED Triage Notes (Signed)
Pt c/o multiple episodes 10+ in last hour, denies diarrhea. No one else sick in home, pt continually heaving in triage. +etoh tonight

## 2017-09-13 NOTE — ED Notes (Signed)
Declined W/C at D/C and was escorted to lobby by RN. 

## 2017-09-13 NOTE — ED Provider Notes (Signed)
[facility] Provider Note   CSN: [id number] Arrival date & time: 09/13/17  0423     History   Chief Complaint Chief Complaint  Patient presents with  . Emesis    HPI Sally RiddleMichaela Roberts is a 22 y.o. female.  Patient presents with complaint of vomiting multiple times starting approximately 3 AM today.  She reports drinking approximately 4 shots of alcohol last night.  She believes that her symptoms started because she was drinking.  She has not had symptoms like this in the past.  No previous abdominal surgeries.  She denies abdominal pain.  She states some mild burning in her chest due to vomiting.  No shortness of breath.  She tried to hydrate herself at home but vomited up water she was drinking.  She denies any bloody or bilious vomiting.  No stool or bowel changes. +THC use. The onset of this condition was acute. The course is constant. Aggravating factors: none. Alleviating factors: none.       Past Medical History:  Diagnosis Date  . Allergy     There are no active problems to display for this patient.   History reviewed. No pertinent surgical history.   OB History   None      Home Medications    Prior to Admission medications   Medication Sig Start Date End Date Taking? Authorizing Provider  ondansetron (ZOFRAN ODT) 4 MG disintegrating tablet Take 1 tablet (4 mg total) by mouth every 8 (eight) hours as needed for nausea or vomiting. 07/23/16  Yes Long, Arlyss RepressJoshua G, MD  ondansetron (ZOFRAN) 4 MG tablet Take 1 tablet (4 mg total) by mouth every 6 (six) hours as needed for nausea or vomiting. 06/14/17  Yes Raeford RazorKohut, Stephen, MD  benzonatate (TESSALON) 100 MG capsule Take 1-2 capsules (100-200 mg total) by mouth 3 (three) times daily as needed for cough. Patient not taking: Reported on 09/13/2017 12/23/15   Ethelda ChickSmith, Kristi M, MD  loratadine-pseudoephedrine (CLARITIN-D 12-HOUR) 5-120 MG tablet Take 1 tablet by mouth 2 (two) times  daily. Patient not taking: Reported on 09/13/2017 12/23/15   Ethelda ChickSmith, Kristi M, MD    Family History Family History  Problem Relation Age of Onset  . Diabetes Maternal Grandfather   . Diabetes Paternal Grandmother     Social History Social History   Tobacco Use  . Smoking status: Current Every Day Smoker  . Smokeless tobacco: Never Used  Substance Use Topics  . Alcohol use: Yes    Alcohol/week: 0.0 oz  . Drug use: Yes    Types: Marijuana     Allergies   Shellfish allergy   Review of Systems Review of Systems  Constitutional: Negative for fever.  HENT: Negative for rhinorrhea and sore throat.   Eyes: Negative for redness.  Respiratory: Positive for chest tightness. Negative for cough and shortness of breath.   Cardiovascular: Negative for chest pain.  Gastrointestinal: Positive for nausea and vomiting. Negative for abdominal pain and diarrhea.  Genitourinary: Negative for dysuria.  Musculoskeletal: Negative for myalgias.  Skin: Negative for rash.  Neurological: Negative for headaches.     Physical Exam Updated Vital Signs BP 120/75   Pulse (!) 50   Temp 97.6 F (36.4 C) (Oral)   Resp 16   Ht 5\' 5"  (1.651 m)   Wt 44.5 kg (98 lb)   LMP 09/03/2017   SpO2 100%   BMI 16.31 kg/m   Physical Exam  Constitutional: She appears well-developed and well-nourished.  Thin, no  distress or vomiting at time of exam  HENT:  Head: Normocephalic and atraumatic.  Eyes: Conjunctivae are normal. Right eye exhibits no discharge. Left eye exhibits no discharge.  Neck: Normal range of motion. Neck supple.  Cardiovascular: Normal rate, regular rhythm and normal heart sounds.  Pulmonary/Chest: Effort normal and breath sounds normal.  Abdominal: Soft. There is no tenderness.  Neurological: She is alert.  Skin: Skin is warm and dry.  Psychiatric: She has a normal mood and affect.  Nursing note and vitals reviewed.    ED Treatments / Results  Labs (all labs ordered are listed,  but only abnormal results are displayed) Labs Reviewed  COMPREHENSIVE METABOLIC PANEL - Abnormal; Notable for the following components:      Result Value   Potassium 3.4 (*)    Glucose, Bld 113 (*)    BUN <5 (*)    All other components within normal limits  CBC - Abnormal; Notable for the following components:   RBC 3.68 (*)    Hemoglobin 11.7 (*)    All other components within normal limits  URINALYSIS, ROUTINE W REFLEX MICROSCOPIC - Abnormal; Notable for the following components:   pH 9.0 (*)    Ketones, ur 5 (*)    Protein, ur 30 (*)    All other components within normal limits  LIPASE, BLOOD  ETHANOL  I-STAT BETA HCG BLOOD, ED ([facility], [facility], [facility] ONLY)    EKG None  Radiology No results found.  Procedures Procedures (including critical care time)  Medications Ordered in ED Medications  sodium chloride 0.9 % bolus 2,000 mL (2,000 mLs Intravenous New Bag/Given 09/13/17 0659)  ondansetron (ZOFRAN-ODT) disintegrating tablet 4 mg (4 mg Oral Given 09/13/17 0449)     Initial Impression / Assessment and Plan / ED Course  I have reviewed the triage vital signs and the nursing notes.  Pertinent labs & imaging results that were available during my care of the patient were reviewed by me and considered in my medical decision making (see chart for details).     Patient seen and examined. Work-up reviewed. Medications ordered.   Vital signs reviewed and are as follows: BP 120/75   Pulse (!) 50   Temp 97.6 F (36.4 C) (Oral)   Resp 16   Ht 5\' 5"  (1.651 m)   Wt 44.5 kg (98 lb)   LMP 09/03/2017   SpO2 100%   BMI 16.31 kg/m   9:35 AM patient improving.  She continues to have some chest tightness.  Patient will try to eat some crackers.  11:18 AM Patient doing well.  She vomited after GI cocktail however it did help her chest burning.  We will discharge home with Zofran, clear liquids for the next 24 hours.  Encouraged avoidance of alcohol, NSAIDs.  The patient was urged to  return to the Emergency Department immediately with worsening of current symptoms, worsening abdominal pain, persistent vomiting, blood noted in stools, fever, or any other concerns. The patient verbalized understanding.    Final Clinical Impressions(s) / ED Diagnoses   Final diagnoses:  Acute alcoholic gastritis without hemorrhage  Non-intractable vomiting with nausea, unspecified vomiting type   N/V: After EtOH use. Labs reassuring. Improving in ED. Patient with abdominal pain. Vitals are stable, no fever. No signs of dehydration, patient is tolerating PO's. Lungs are clear and no signs suggestive of PNA. Low concern for appendicitis, cholecystitis, pancreatitis, ruptured viscus, UTI, kidney stone, aortic dissection, aortic aneurysm or other emergent abdominal etiology. Supportive therapy indicated with  return if symptoms worsen.    ED Discharge Orders        Ordered    ondansetron (ZOFRAN ODT) 4 MG disintegrating tablet  Every 8 hours PRN     09/13/17 1120       Renne Crigler, PA-C 09/13/17 1122    Wynetta Fines, MD 09/14/17 [phone]

## 2017-12-21 IMAGING — DX DG CHEST 2V
2 series · 2 of 2 positions shown · non-contrast
Comparison: 12/23/2015

CLINICAL DATA: Chest pain

EXAM:
CHEST  2 VIEW

[w chest pa]
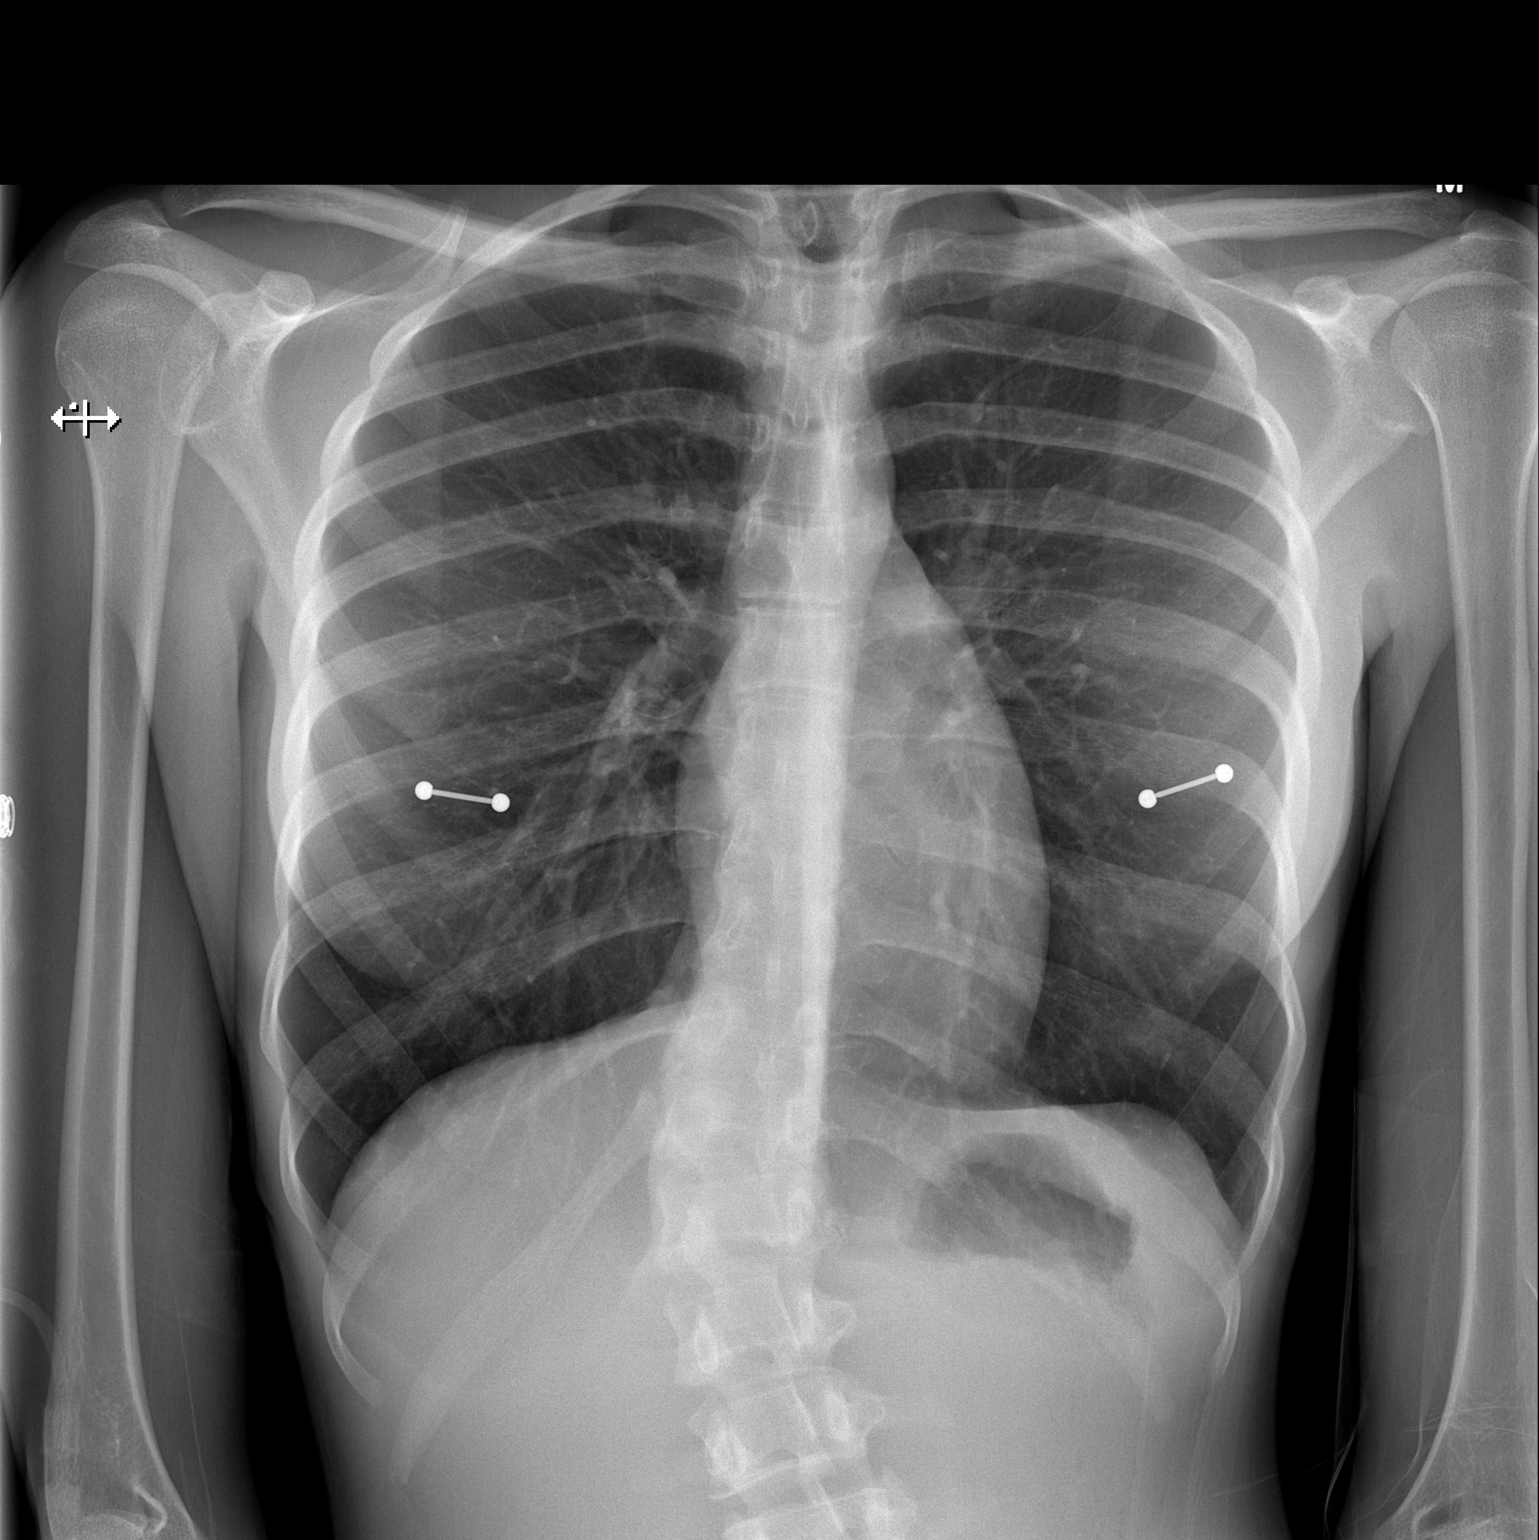

[w chest lat]
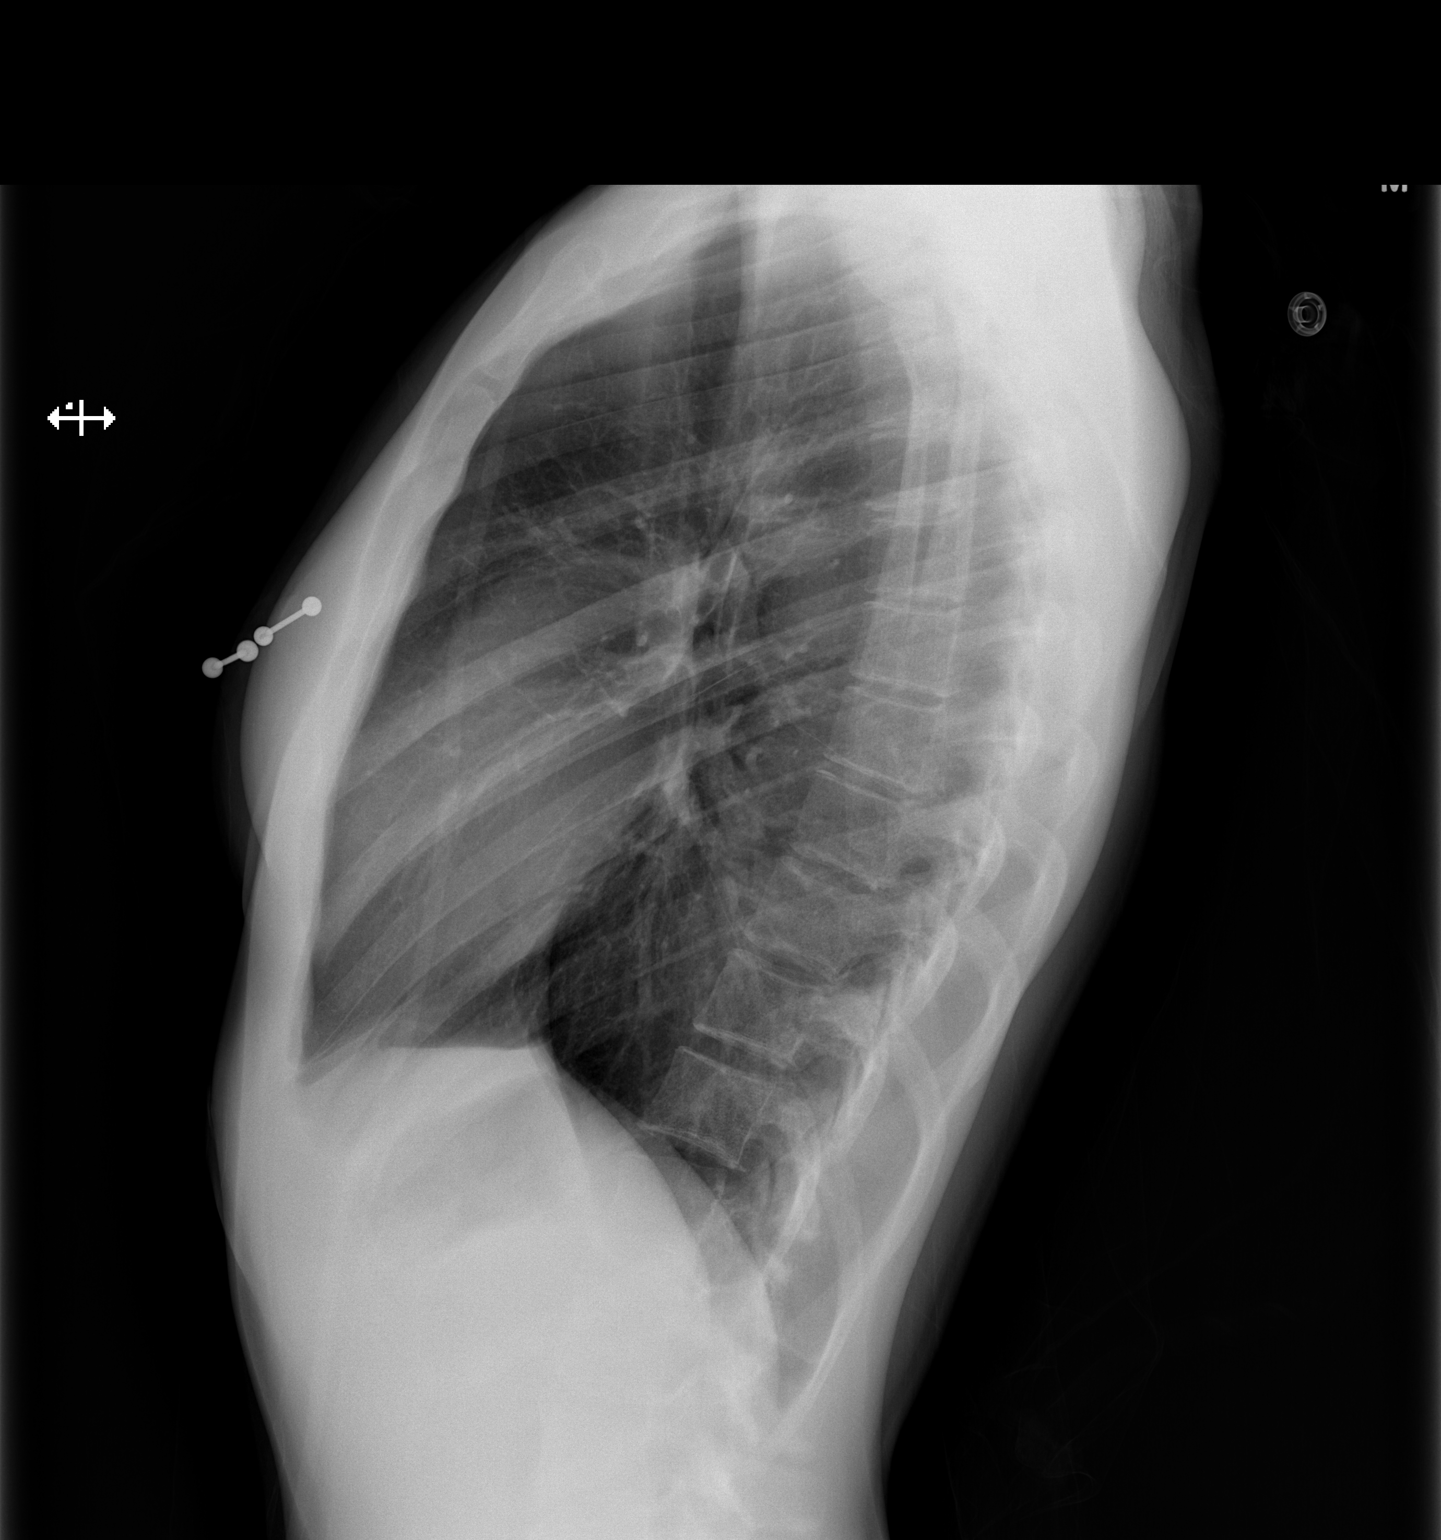

[2 of 2 positions shown; findings below may reference images not displayed]

FINDINGS: Heart and mediastinal contours are within normal limits. No focal
opacities or effusions. No acute bony abnormality. Rightward
scoliosis in the thoracolumbar spine.
IMPRESSION: No active cardiopulmonary disease.
